# Patient Record
Sex: Female | Born: 2000 | Race: White | Hispanic: No | Marital: Single | Smoking: Never smoker
Health system: Southern US, Community
[De-identification: ages and names within clinical notes are randomized; demographics above are authoritative.]

---
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
## 2019-08-21 ENCOUNTER — Encounter (HOSPITAL_COMMUNITY): Payer: Self-pay

## 2019-08-21 ENCOUNTER — Other Ambulatory Visit: Payer: Self-pay

## 2019-08-21 ENCOUNTER — Ambulatory Visit (HOSPITAL_COMMUNITY)
Admission: EM | Admit: 2019-08-21 | Discharge: 2019-08-21 | Disposition: A | Discharge status: Home / Self Care | Payer: 59 | Attending: Family Medicine | Admitting: Family Medicine

## 2019-08-21 ENCOUNTER — Ambulatory Visit (INDEPENDENT_AMBULATORY_CARE_PROVIDER_SITE_OTHER): Payer: 59

## 2019-08-21 DIAGNOSIS — S60221A Contusion of right hand, initial encounter: Secondary | ICD-10-CM

## 2019-08-21 NOTE — Discharge Instructions (Addendum)
Ibuprofen for pain Ice to reduce pain and swelling Limit use of hand until pain improves Return as needed
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
## 2020-04-15 ENCOUNTER — Other Ambulatory Visit: Payer: Self-pay | Admitting: Family Medicine

## 2020-04-15 ENCOUNTER — Other Ambulatory Visit: Payer: Self-pay

## 2020-04-15 ENCOUNTER — Ambulatory Visit: Payer: Self-pay

## 2020-04-15 DIAGNOSIS — M25572 Pain in left ankle and joints of left foot: Secondary | ICD-10-CM
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
## 2023-03-05 ENCOUNTER — Ambulatory Visit (HOSPITAL_COMMUNITY): Payer: 59
